# Patient Record
Sex: Male | Born: 1995 | Race: White | Hispanic: No | Marital: Single
Health system: Southern US, Community
[De-identification: ages and names within clinical notes are randomized; demographics above are authoritative.]

---
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
## 2016-02-17 ENCOUNTER — Other Ambulatory Visit: Payer: Self-pay | Admitting: Nephrology

## 2016-02-17 DIAGNOSIS — S37001A Unspecified injury of right kidney, initial encounter: Secondary | ICD-10-CM

## 2016-02-18 ENCOUNTER — Ambulatory Visit
Admission: RE | Admit: 2016-02-18 | Discharge: 2016-02-18 | Disposition: A | Discharge status: Home / Self Care | Payer: PRIVATE HEALTH INSURANCE | Source: Ambulatory Visit | Attending: Nephrology | Admitting: Nephrology

## 2016-02-18 DIAGNOSIS — S37001A Unspecified injury of right kidney, initial encounter: Secondary | ICD-10-CM

## 2016-10-27 ENCOUNTER — Ambulatory Visit (INDEPENDENT_AMBULATORY_CARE_PROVIDER_SITE_OTHER): Payer: PRIVATE HEALTH INSURANCE | Admitting: Family

## 2016-10-27 ENCOUNTER — Ambulatory Visit (INDEPENDENT_AMBULATORY_CARE_PROVIDER_SITE_OTHER): Payer: PRIVATE HEALTH INSURANCE

## 2016-10-27 DIAGNOSIS — M79672 Pain in left foot: Secondary | ICD-10-CM

## 2016-10-30 NOTE — Progress Notes (Signed)
Patient not seen.

## 2017-05-31 IMAGING — US US RENAL
1 series · 14 of 25 positions shown · non-contrast
Comparison: None.

CLINICAL DATA: Right renal injury

EXAM:
RENAL / URINARY TRACT ULTRASOUND COMPLETE

[Series 1: us renal · 0.20mm/px · 14 of 37 slices shown]
[im 1/37]
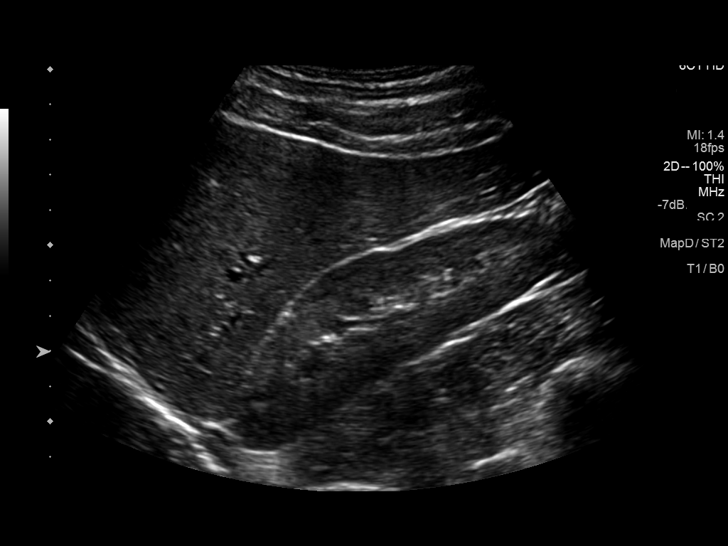
[im 4/37]
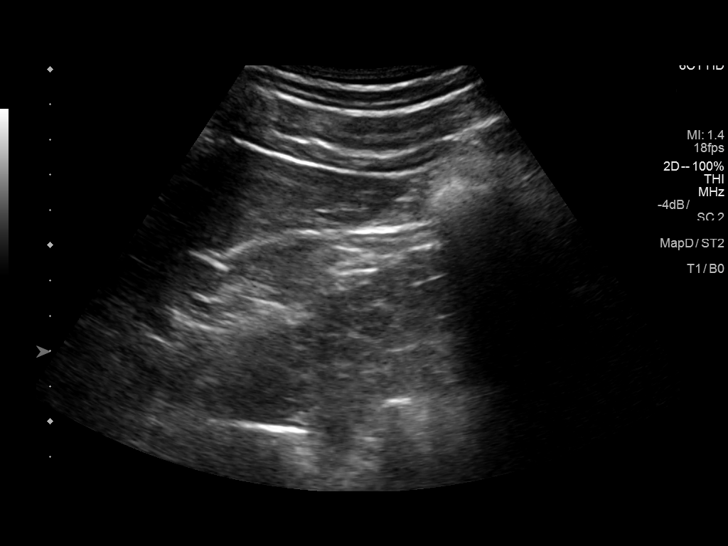
[im 7/37]
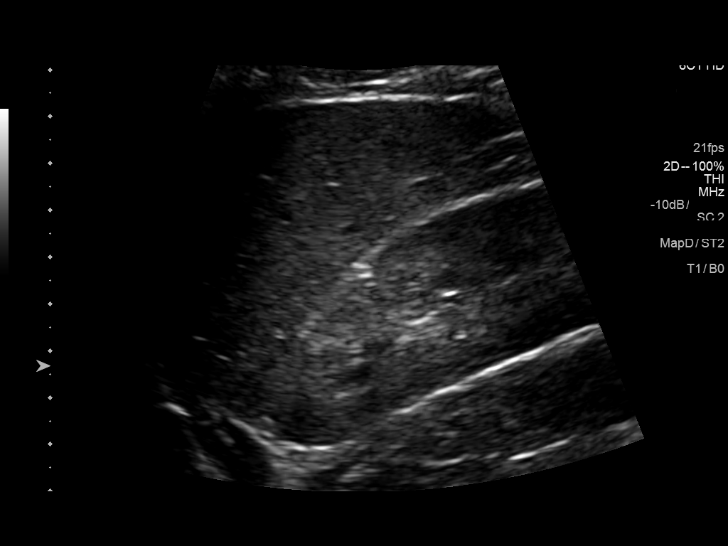
[im 10/37]
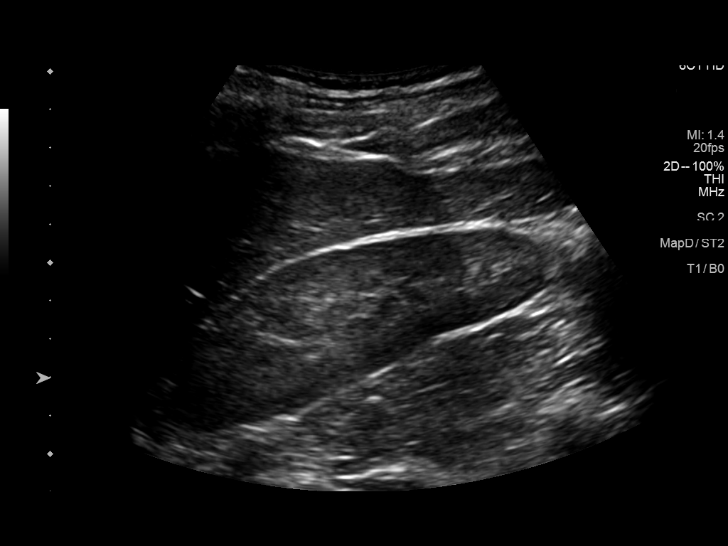
[im 13/37]
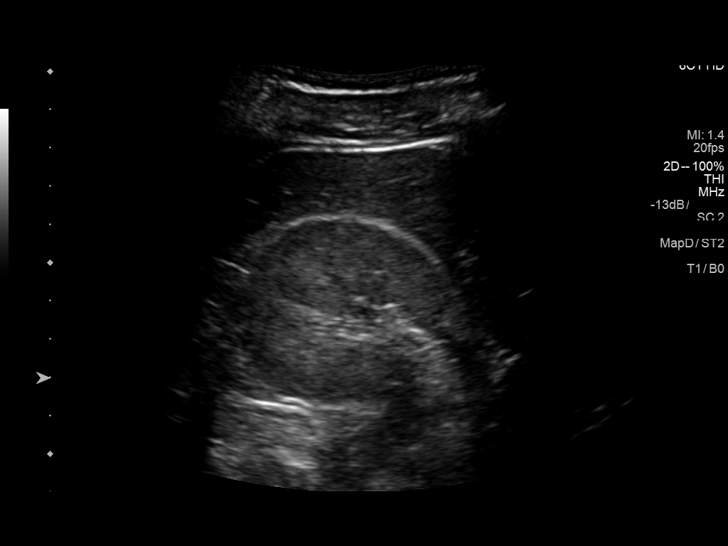
[im 14/37]
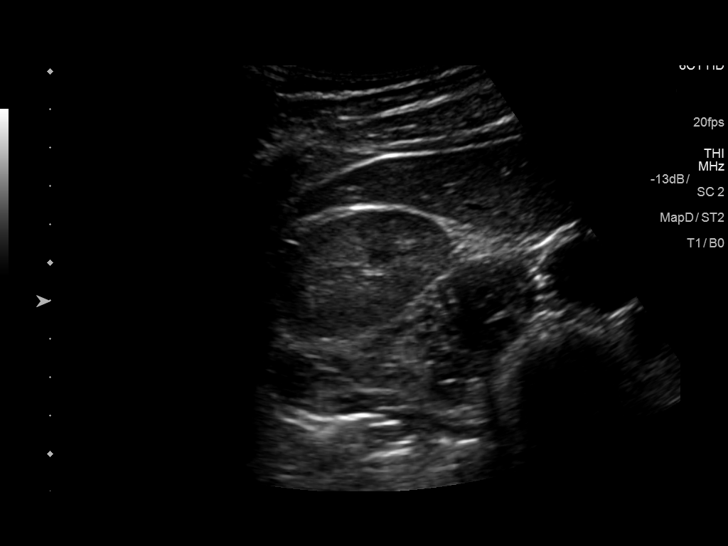
[im 17/37]
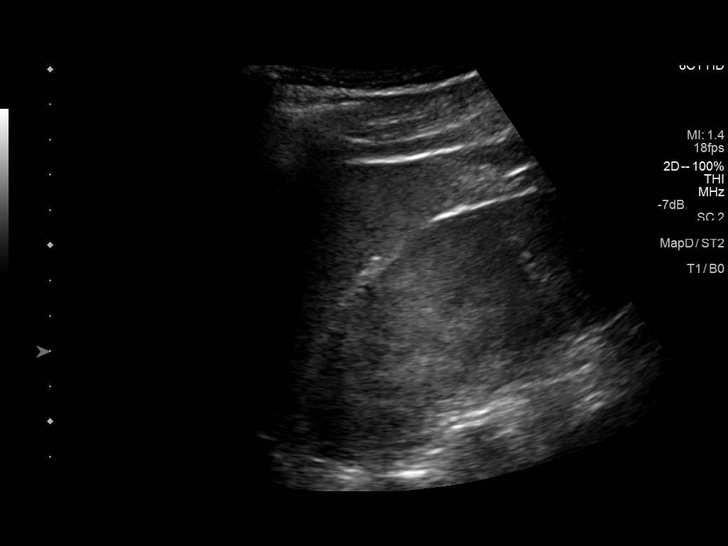
[im 20/37]
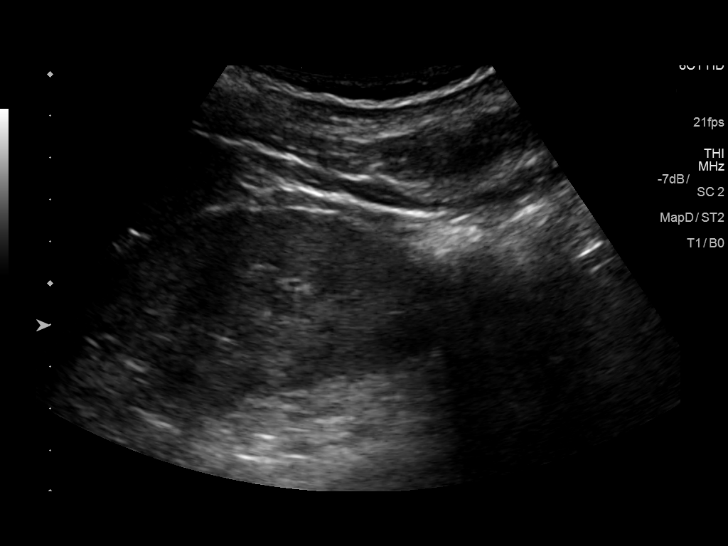
[im 23/37]
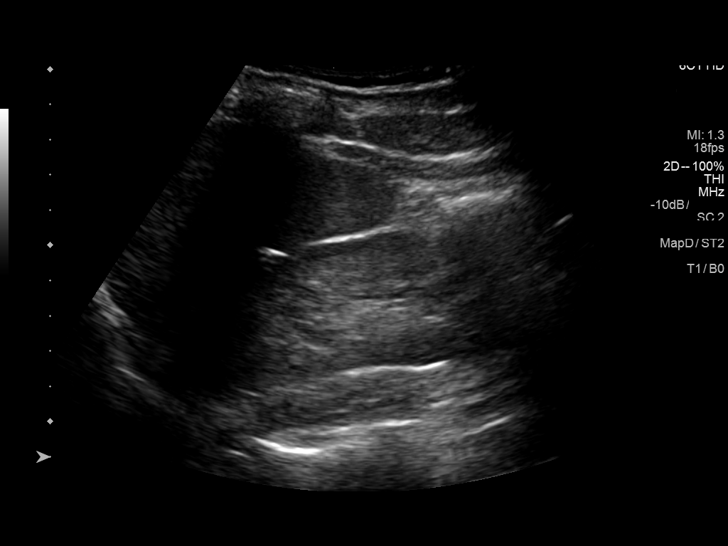
[im 25/37]
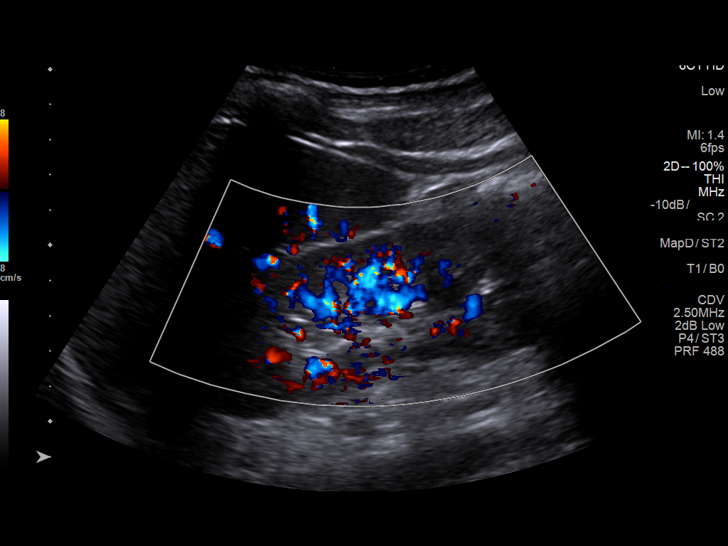
[im 28/37]
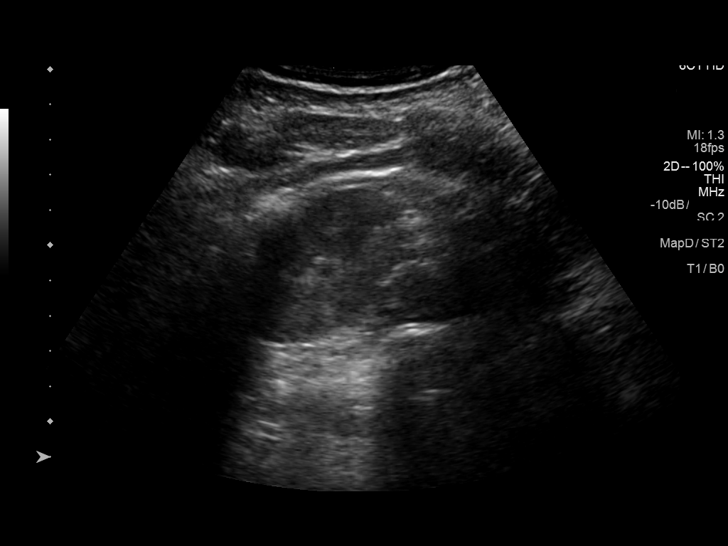
[im 31/37]
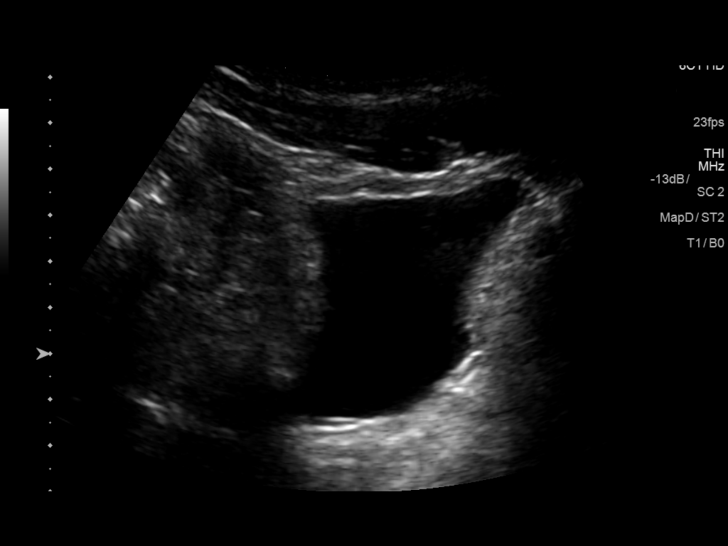
[im 34/37]
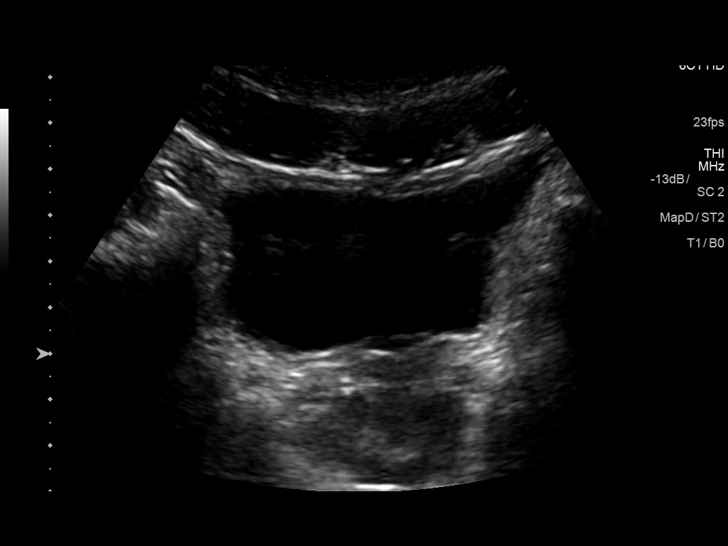
[im 37/37]
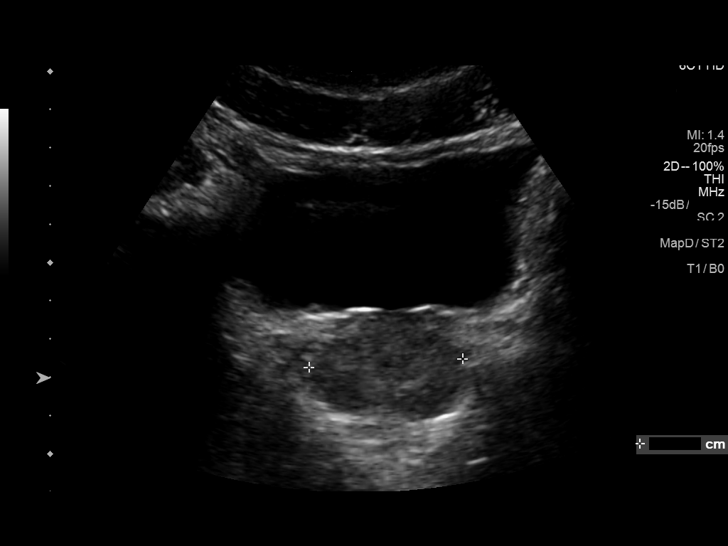

[14 of 25 positions shown; findings below may reference images not displayed]

FINDINGS: Right Kidney:

Length: 10.3 cm. Echogenicity within normal limits. No mass or
hydronephrosis visualized.

Left Kidney:

Length: 11.6 cm. Echogenicity within normal limits. No mass or
hydronephrosis visualized.

Bladder:

Appears normal for degree of bladder distention.
IMPRESSION: No acute abnormalities identified.  No hydronephrosis.
